# Patient Record
Sex: Male | Born: 1970 | Race: White | Hispanic: No | Marital: Married
Health system: Southern US, Community
[De-identification: ages and names within clinical notes are randomized; demographics above are authoritative.]

---

## 2013-08-25 LAB — CBC
HCT: 43.1 % (ref 40.0–52.0)
HGB: 15.2 g/dL (ref 13.0–18.0)
MCH: 28.9 pg (ref 26.0–34.0)
MCV: 82 fL (ref 80–100)
Platelet: 299 10*3/uL (ref 150–440)
RDW: 13.2 % (ref 11.5–14.5)

## 2013-08-25 LAB — TROPONIN I: Troponin-I: <0.02 ng/mL

## 2013-08-26 ENCOUNTER — Observation Stay: Payer: Self-pay | Admitting: Internal Medicine

## 2013-08-26 LAB — COMPREHENSIVE METABOLIC PANEL
Albumin: 3.5 g/dL (ref 3.4–5.0)
Alkaline Phosphatase: 83 U/L
Anion Gap: 9 (ref 7–16)
Bilirubin,Total: 0.8 mg/dL (ref 0.2–1.0)
Calcium, Total: 8.6 mg/dL (ref 8.5–10.1)
Chloride: 94 mmol/L — ABNORMAL LOW (ref 98–107)
Co2: 26 mmol/L (ref 21–32)
Creatinine: 1.02 mg/dL (ref 0.60–1.30)
EGFR (African American): >60
Glucose: 398 mg/dL — ABNORMAL HIGH (ref 65–99)
Osmolality: 277 (ref 275–301)
Potassium: 4 mmol/L (ref 3.5–5.1)
SGOT(AST): 19 U/L (ref 15–37)
SGPT (ALT): 30 U/L (ref 12–78)
Sodium: 129 mmol/L — ABNORMAL LOW (ref 136–145)
Total Protein: 7.7 g/dL (ref 6.4–8.2)

## 2013-08-26 LAB — LIPID PANEL
Cholesterol: 316 mg/dL — ABNORMAL HIGH (ref 0–200)
HDL Cholesterol: 20 mg/dL — ABNORMAL LOW (ref 40–60)
Triglycerides: 2786 mg/dL — ABNORMAL HIGH (ref 0–200)

## 2013-08-26 LAB — TROPONIN I
Troponin-I: <0.02 ng/mL
Troponin-I: <0.02 ng/mL

## 2013-08-26 LAB — CK-MB: CK-MB: <0.5 ng/mL — ABNORMAL LOW (ref 0.5–3.6)

## 2013-08-26 LAB — CK
CK, Total: 67 U/L (ref 35–232)
CK, Total: 55 U/L (ref 35–232)

## 2013-08-26 LAB — PROTIME-INR: Prothrombin Time: 13 secs (ref 11.5–14.7)

## 2013-08-26 LAB — HEMOGLOBIN A1C: Hemoglobin A1C: 11.5 % — ABNORMAL HIGH (ref 4.2–6.3)

## 2013-08-27 LAB — CBC WITH DIFFERENTIAL/PLATELET
Basophil #: 0.1 10*3/uL (ref 0.0–0.1)
Basophil %: 0.8 %
Eosinophil #: 0.1 10*3/uL (ref 0.0–0.7)
Eosinophil %: 0.8 %
HGB: 13.4 g/dL (ref 13.0–18.0)
MCV: 82 fL (ref 80–100)
Monocyte #: 0.9 x10 3/mm (ref 0.2–1.0)
Monocyte %: 8.6 %
Neutrophil #: 8.1 10*3/uL — ABNORMAL HIGH (ref 1.4–6.5)
Platelet: 222 10*3/uL (ref 150–440)
RBC: 4.67 10*6/uL (ref 4.40–5.90)
WBC: 10.4 10*3/uL (ref 3.8–10.6)

## 2013-08-27 LAB — BASIC METABOLIC PANEL
Creatinine: 0.68 mg/dL (ref 0.60–1.30)
EGFR (African American): >60
EGFR (Non-African Amer.): >60
Osmolality: 261 (ref 275–301)
Potassium: 3.9 mmol/L (ref 3.5–5.1)
Sodium: 128 mmol/L — ABNORMAL LOW (ref 136–145)

## 2013-08-27 LAB — MAGNESIUM: Magnesium: 1.9 mg/dL

## 2014-10-08 IMAGING — CT CT ANGIO CHEST
2 of 6 series · 17 of 46 positions shown · IV contrast (APPLIED)
Comparison: None.

CLINICAL DATA: Acute onset of chest pain, back pain, and neck pain
with pressure. Sharp pleuritic chest pain.

EXAM:
CT ANGIOGRAPHY CHEST WITH CONTRAST
TECHNIQUE: Multidetector CT imaging of the chest was performed using the
standard protocol during bolus administration of intravenous
contrast. Multiplanar CT image reconstructions including MIPs were
obtained to evaluate the vascular anatomy.
CONTRAST:  100 mL Isovue 370

[Series 6: cta chest · axial · 0.81mm/px · z∈[-370,-88]mm · 14 of 155 slices shown]
[im 7/155  lung]
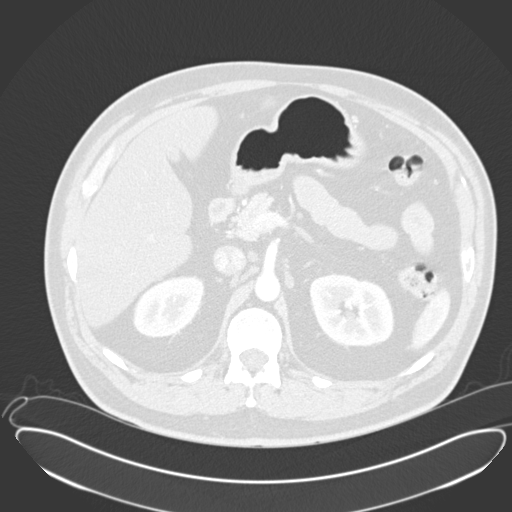
[im 21/155  soft-tissue]
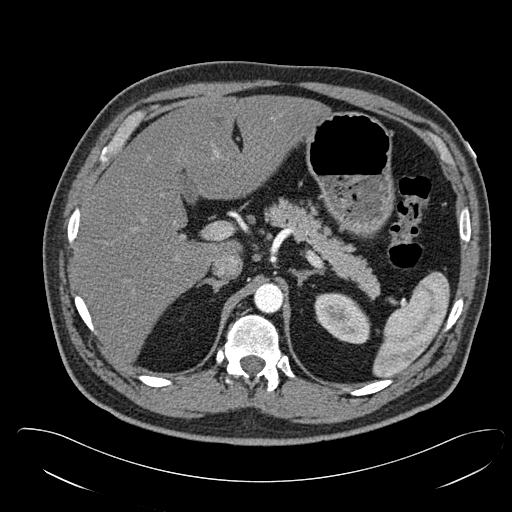
[im 27/155  lung]
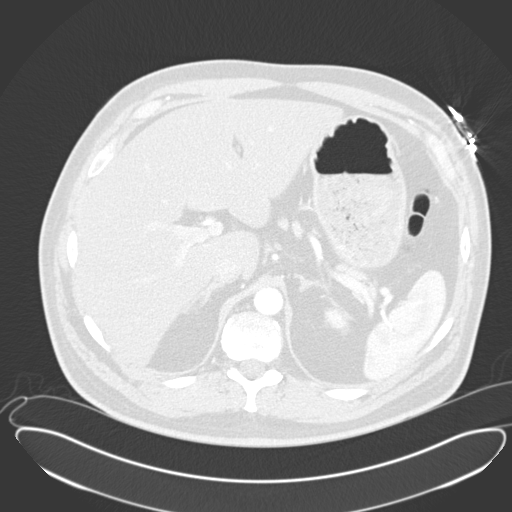
[im 41/155  soft-tissue]
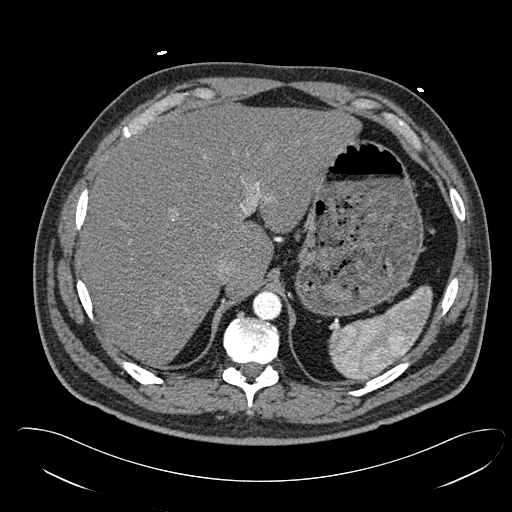
[im 54/155  lung]
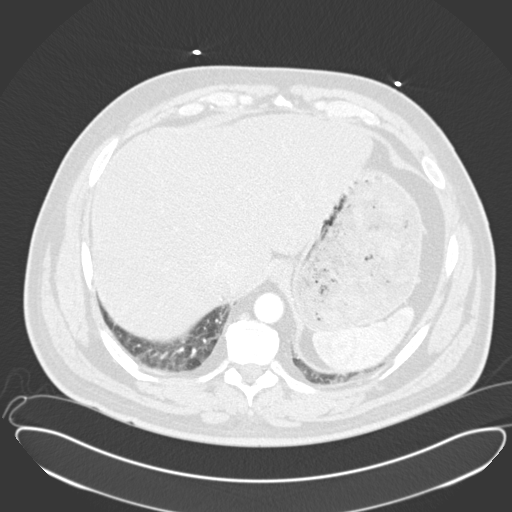
[im 61/155  soft-tissue]
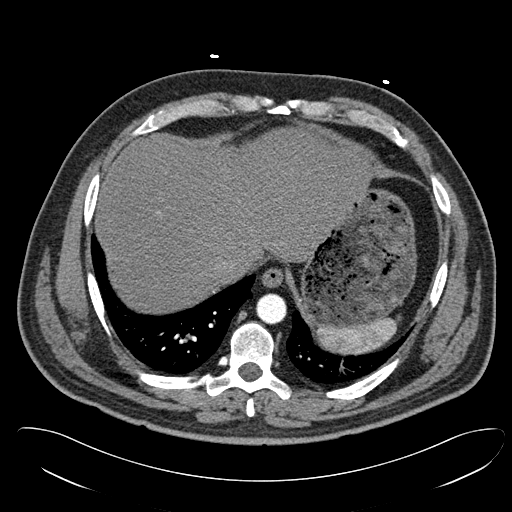
[im 74/155  lung]
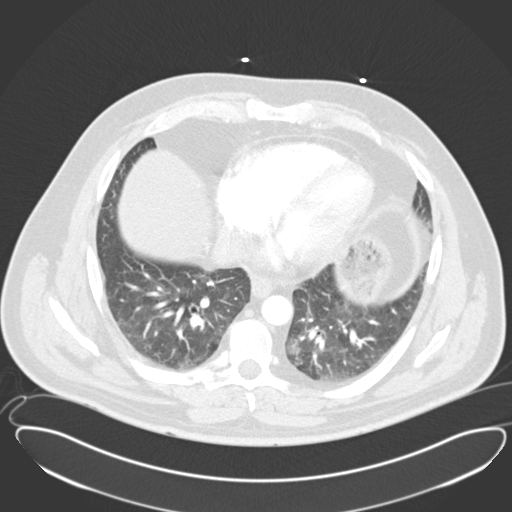
[im 81/155  soft-tissue]
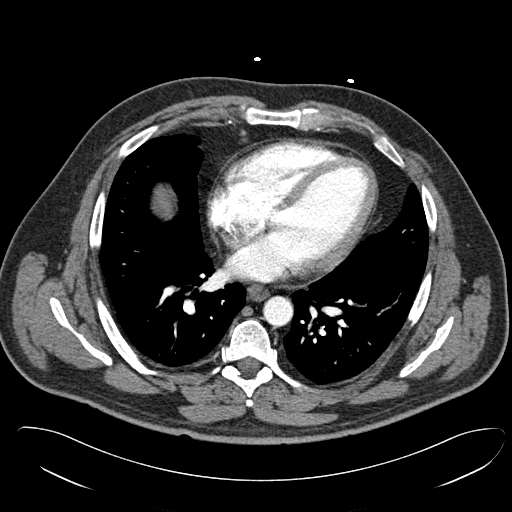
[im 94/155  lung]
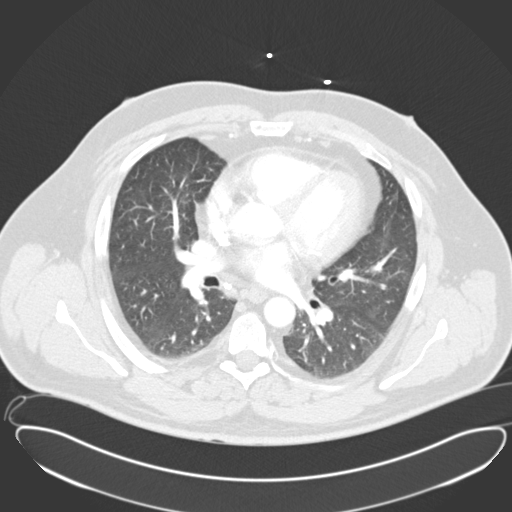
[im 101/155  soft-tissue]
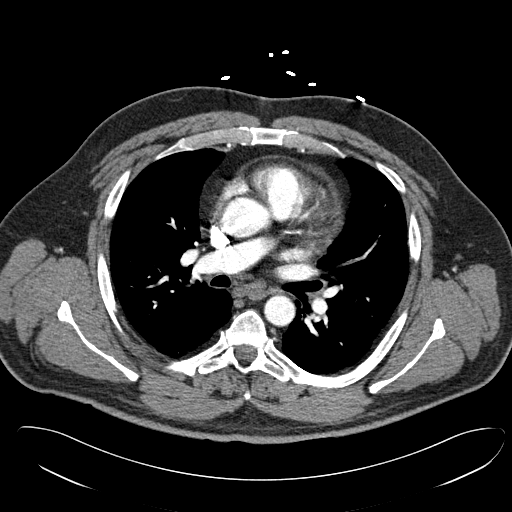
[im 114/155  lung]
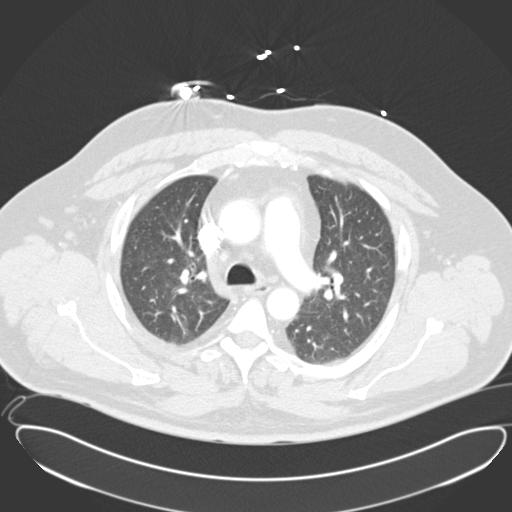
[im 128/155  soft-tissue]
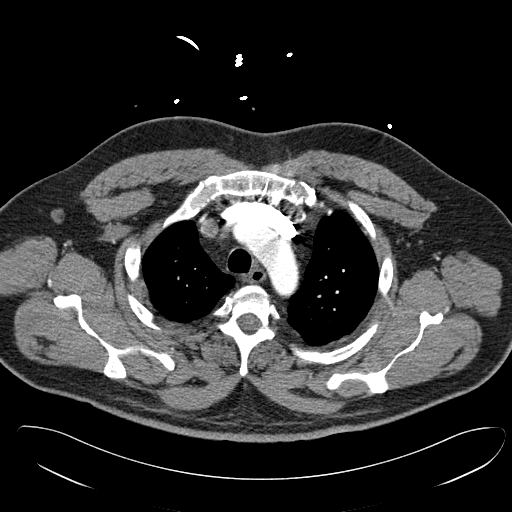
[im 134/155  lung]
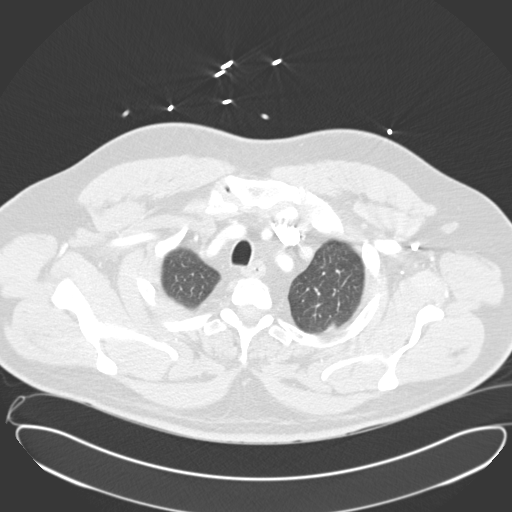
[im 148/155  soft-tissue]
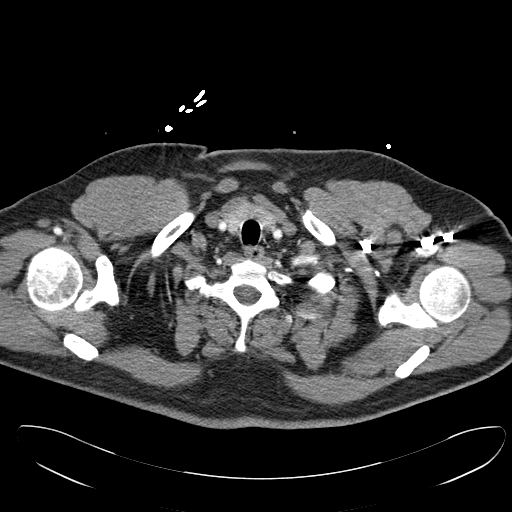

[Series 7: cor cta chest mpr · coronal · 0.59mm/px · 3 of 145 slices shown]
[im 37/145  soft-tissue]
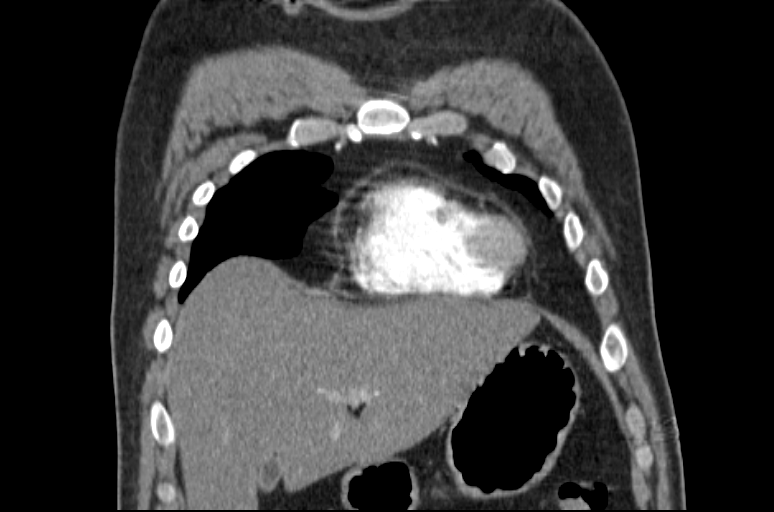
[im 73/145  soft-tissue]
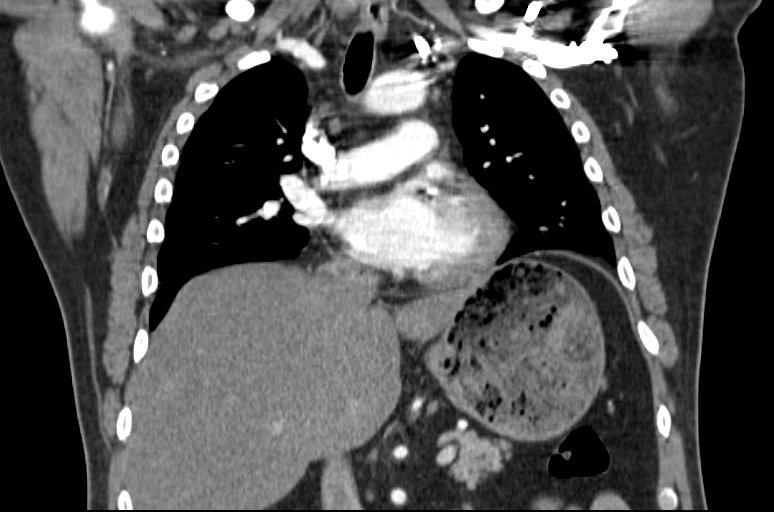
[im 109/145  soft-tissue]
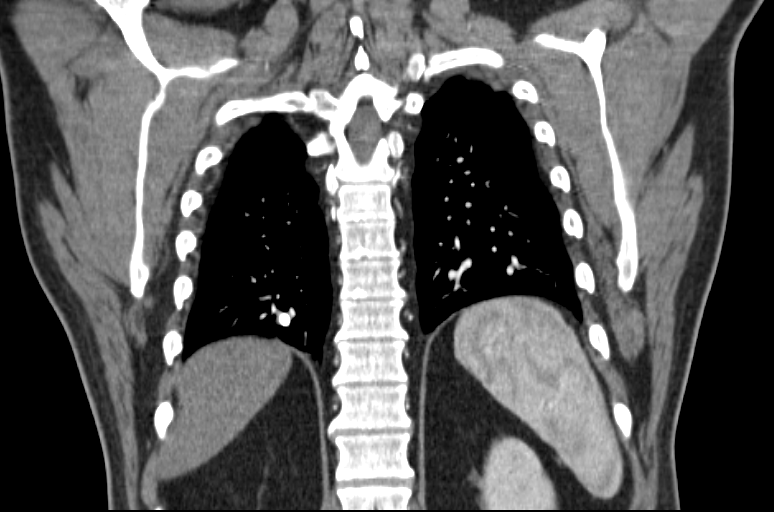

[17 of 46 positions shown; findings below may reference images not displayed]

FINDINGS: Noncontrast images of the chest demonstrate Coronary artery
calcifications. Minimal aortic calcification. No evidence of
intramural hematoma. Calcified granulomas in the right lung and
right hilum as well as in the subcarinal mediastinum.

Contrast enhanced CTA images demonstrate moderately good
opacification of the central and segmental pulmonary arteries with
technically adequate study. No focal filling defects are
demonstrated. No evidence of significant pulmonary embolus. Normal
caliber thoracic aorta. No evidence of aortic dissection or
aneurysm.

Normal heart size. Great vessels are patent. No significant
lymphadenopathy in the chest. Esophagus is decompressed. No pleural
effusions. Visualized portions of the upper abdominal organs
demonstrate diffuse fatty infiltration of the liver.

Visualization of lung fields is limited due to respiratory motion
artifact. Dependent changes in the lung bases. No definite
infiltration or consolidation. No pneumothorax. Airways appear
patent. No destructive bone lesions.

Review of the MIP images confirms the above findings.
IMPRESSION: No evidence of significant pulmonary embolus. No evidence of aortic
dissection. No evidence of active lung disease. Calcified granulomas
on the right. Diffuse fatty infiltration of the liver.

## 2014-12-18 NOTE — Consult Note (Signed)
PATIENT NAME:  Francisco Martinez, Francisco Martinez#:  [id number] DATE OF BIRTH:  [birth year]  DATE OF CONSULTATION:  08/26/2013  REFERRING PHYSICIAN:  Krystal EatonShayiq Ahmadzia, MD  CONSULTING PHYSICIAN:  A. Wendall MolaMelissa Hernandez Losasso, MD  CHIEF COMPLAINT: New diagnosis of diabetes and hypertriglyceridemia.   HISTORY OF PRESENT ILLNESS: This is a 44 year old male with a history of coronary disease, hypertension, tobacco abuse, who presented last evening to the ED with a complaint of chest pain. The patient recalls that he had some chest tightness throughout the afternoon. He gone to eat dinner at a restaurant which included hot wings and about two hours after the meal had worsening of his chest pain. He tried to take Rolaids with no relief. He described the pain as pleuritic in nature. It was nonradiating. He was not having any nausea. He was not having any shortness of breath or diaphoresis. In the ED,  his EKG showed Q waves in the anterolateral leads, no ST elevation. CT chest PE protocol was negative for PE, however, did demonstrate coronary calcification and hepatic steatosis. Around 5:00 a.m., the patient was given a GI cocktail and had complete resolution of his pain at that time. He has had three sets of cardiac enzymes which have been negative. Additional lab studies revealed an elevated blood sugar of 398, hemoglobin A1c 11.5%, consistent with new diagnosis of type 2 diabete, elevated triglycerides of 2786 with a normal lipase and mildly elevated white cell count of 11.7. He currently reports poor appetite, denies pain and has no other complaints.   Regarding diabetes, he has no personal or family history of diabetes. He denies recent polyuria, polydipsia or blurred vision. Denies recent weight loss. He does drink many glasses of sweet tea every day. He is a smoker and intends to quit. He has been smoking for over 20 years.   PAST MEDICAL HISTORY: 1.  Coronary disease status post PCI with stent placement. 2008.  2.  Hypertension.  3.   Tobacco dependence.   OUTPATIENT MEDICATIONS: None.   INPATIENT MEDICATIONS:  1.  Gemfibrozil 600 mg b.i.d.  2.  NovoLog insulin sliding scale.  3.  Nicotine patch.  4.  Pantoprazole 40 mg daily.  5.  Aspirin 81 mg daily.  6.  Normal saline at 75 mL/hour.   SOCIAL HISTORY: [place] in [place], but [place]. Again, he is a smoker.   FAMILY HISTORY: No known diabetes. Mother had coronary disease.   REVIEW OF SYSTEMS:  GENERAL: No weight loss. No fevers.  HEENT: No blurred vision. No sore throat.  NECK: No neck pain. No dysphasia.  CARDIAC: He said chest pain as per history of present illness. Denies palpitations.  PULMONARY: Denies cough. Denies shortness of breath.  ABDOMEN: Decreased appetite, and some abdominal discomfort at this time. No nausea.  EXTREMITIES: Denies leg swelling.  SKIN: Denies recent skin changes or pruritus.  ENDOCRINE:   Denies heat or cold intolerance.  GENITOURINARY: Denies dysuria, hematuria.  HEMATOLOGIC: Denies easy bruisability or recent bleeding.   PHYSICAL EXAMINATION: VITAL SIGNS: 70.9 inches, weight 220 pounds, BMI 30.7, blood pressure 118/65, pulse 89, respirations 18, pulse oximetry 95% on room air.  GENERAL: Well-developed, well-nourished white male in no acute distress.  HEENT: Extraocular movements are intact. Oropharynx is clear. Mucous membranes moist.  NECK: Supple.   CARDIAC: Regular rate and rhythm. No audible murmur, rub or gallop.  PULMONARY: Clear to auscultation bilaterally. No wheeze or rhonchi.  ABDOMEN: Mild epigastric tenderness  to palpate, positive bowel sounds.  EXTREMITIES: No edema is present.  SKIN: No rash or dermatopathy is present several tattoos are present.  NEUROLOGIC: Nonfocal. Alert and oriented x 3.  PSYCHIATRIC: Calm and cooperative.   LABORATORY DATA: See history of present illness for details.   RADIOLOGIC DATA:  CT chest with contrast showed no  evidence of PE. No evidence of aortic dissection. No evidence of acute lung disease. Coronary calcifications with minimal aortic calcifications are present. Calcified granulomas in right lung and right hilum as well as subcarinal mediastinum were present. Diffuse fatty infiltration of the liver.   ASSESSMENT: 1.  New diagnosis of diabetes, type II.  2.  Severe hypertriglyceridemia.  3.  Chest pain, cardiac versus gastrointestinal in origin.  4.  Obesity.  5.  Hepatic steatosis on CT scan.  6.  History of coronary artery disease.  7.  Tobacco dependence.   RECOMMENDATIONS:   1.  For diabetes, options include oral medications versus injectable medications. I suspect he will have reasonable glycemic control on oral medications as he is currently diabetic medication naive. Metformin would be the optimal choice, however, he did receive IV contrast last night. I do recommend starting metformin at a low dose of 500 mg twice daily after three days have passed since contrast exposure. In the meantime, we will add pioglitazone 15 mg per day. Will also start basal insulin and continue NovoLog sliding scale at meals and bedtime. I do not anticipate him being discharged on insulin, but rather anticipate discharge on pioglitazone and metformin. Agree with need for diabetes inpatient educator referral. Keep on a low carb, no concentrated sweet diet. Counseled patient about diagnosis, prognosis, complications and management plan.   2.  For hypertriglyceridemia, gemfibrozil has been added. Suspect his triglycerides will significantly improve with better blood sugar control.  3.  He would also benefit from addition of an ACE inhibitor for renal protection. This could be added of the next 1 to 2 days.  4.  The patient was counseled on the importance of tobacco cessation.  5.  He would benefit from weight loss.   Thank you for the kind request for consultation. I will follow along with you.    ____________________________ A. Wendall Mola, MD ams:cc D: 08/26/2013 17:32:34 ET T: 08/26/2013 19:41:22 ET JOB#: [id number]  cc: A. Wendall Mola, MD, <Dictator> Macy Mis MD ELECTRONICALLY SIGNED 08/27/2013 8:42

## 2014-12-19 NOTE — Discharge Summary (Signed)
PATIENT NAME:  Francisco Martinez, Kalan S MR#:  [id number] DATE OF BIRTH:  03/05/1971  DATE OF ADMISSION:  08/26/2013 DATE OF DISCHARGE:  08/27/2013  ADMISSION DIAGNOSES:  1.  Chest pain.  2.  New-onset diabetes.   DISCHARGE DIAGNOSES: 1.  Chest pain.  2.  New-onset diabetes.  3.  Tobacco dependence.  4.  Hyponatremia.    CONSULTATIONS: Dr. Tedd SiasSolum from endocrinology.   LABORATORY, DIAGNOSTIC AND RADIOLOGICAL DATA:   1.  Myoview stress test was negative for ischemia.  2.  Discharge white blood cells 10, hemoglobin 13.4, hematocrit 39, platelets are 222.  3.  Sodium 128, potassium 3.9, chloride 98, bicarb 27, BUN 9, creatinine 0.68, glucose is 191.  4.  Magnesium 1.9.  5.  Troponins x 3 were negative.  6.  LDL was not able to be calculated due to triglycerides of 2786.  7.  CT chest showed no evidence of PE or pneumonia.   HOSPITAL COURSE: This is a 44 year old male who presented with chest pain, found to have new-onset diabetes. For further details, please refer to the H and P. 1.  Chest pain. The patient was admitted to telemetry.  His cardiac enzymes were cycled, all of which were negative. He underwent a stress test which essentially was negative. He was chest pain-free throughout his hospitalization.  2.  New-onset diabetes. We appreciate endocrinology consult. The patient is started on Actos and will be started on metformin on January 2nd.  He could not start it right away due to the contrast he needed for the CT chest. We appreciate Dr. Pricilla HandlerSolum's consult. The patient will follow up as an outpatient with diabetes education as well as Dr. Tedd SiasSolum. Dr. Tedd SiasSolum also had recommended starting low-dose lisinopril for renal protection, which was started.  3.  Elevated triglyceridemia due to diabetes. I suspect that his triglycerides will improve once his diabetes is under control. He is on gemfibrozil for now.  4.  Hyponatremia of unclear etiology. The patient will follow up for his low sodium.  5.  Tobacco  abuse. The patient was counseled for greater than 3 minutes and he does not want a nicotine patch at discharge.   DISCHARGE MEDICATIONS: 1.  Lisinopril 5 mg daily.  2.  Pioglitazone 50 mg daily.  3.  Gemfibrozil 600 mg b.i.d.  4.  Nicotine 21 mg per 24 hours.  5.  Glucophage 500 mg b.i.d. to start on 08/29/2013.    DISCHARGE DIET: Low fat, carbohydrate diet.   DISCHARGE ACTIVITY: As tolerated.   DISCHARGE REFERRAL: Diabetes education.   DISCHARGE FOLLOWUP: The patient will follow up first available with Dr. Tedd SiasSolum.    The patient is medically stable for discharge.    TIME SPENT: 35 minutes.     ____________________________ Janyth ContesSital P. Juliene PinaMody, MD spm:cs D: 08/27/2013 16:01:01 ET T: 08/27/2013 20:11:53 ET JOB#: [id number]  cc: Lakya Schrupp P. Juliene PinaMody, MD, <Dictator> Janyth ContesSITAL P Fiora Weill MD ELECTRONICALLY SIGNED 08/28/2013 13:55

## 2014-12-19 NOTE — H&P (Signed)
PATIENT NAME:  Francisco Martinez, Francisco Martinez MR#:  [id number] DATE OF BIRTH:  08/21/1971  DATE OF ADMISSION:  08/25/2013  REFERRING PHYSICIAN: Dr. Lurline Hare.    PRIMARY CARE PHYSICIAN: None.   CARDIOLOGIST: None.   CHIEF COMPLAINT: Chest pain.   HISTORY OF PRESENT ILLNESS: This is a 44 year old male with a significant past medical history of coronary artery disease, status post stent in 2008, history of hypertension, hyperlipidemia, still ongoing tobacco use. The patient presents with complaint of chest pain. The patient lives in [place]. He is currently in [place]. 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 The patient reports chest discomfort, midsternal, radiating to the neck, pleuritic quality. The pain is worsened with deep inspiration and ambulation. The patient had CT chest with IV contrast which came back negative for PE. The patient's EKG showing Q waves in the anterolateral leads. No old EKG to compare. His first troponin was negative. The patient received 324 of aspirin in the ED. As well, he received subcutaneous Lovenox 1 mg/kg out of concern of unstable angina. Hospitalist service was requested to admit the patient for further evaluation. Upon initial presentation, the patient's blood pressure was on the lower side, but it did respond to fluid bolus. The patient reports he stopped taking his home medications over the last year or so as he does not think he needs them anymore basically.    PAST MEDICAL HISTORY:  1. Tobacco abuse.  2. Coronary artery disease, status post stent in 2008.   3. Hyperlipidemia.  4. Hypertension.  5. Noncompliance with medication.   PAST SURGICAL HISTORY: Status post cardiac cath and stent in 2008.   SOCIAL HISTORY: The patient smokes 1 pack per day. No alcohol. No illicit drug use.   FAMILY HISTORY: Significant for coronary artery disease.   ALLERGIES: No known drug allergies.   HOME MEDICATIONS: Currently not taking any home meds.   REVIEW OF SYSTEMS:   CONSTITUTIONAL: Denies fever, chills, fatigue, weakness, weight gain, weight loss.  EYES: Denies blurry vision, double vision, inflammation, glaucoma.  ENT: Denies tinnitus, ear pain, hearing loss.  RESPIRATORY: Denies any cough, wheezing. Complains of shortness of breath and painful respiration.  CARDIOVASCULAR: Complains of chest pain. Denies edema, arrhythmia, palpitation.  GASTROINTESTINAL: Denies nausea, vomiting, diarrhea, abdominal pain, hematemesis.  GENITOURINARY: Denies dysuria, hematuria, renal colic.  ENDOCRINE: Denies polyuria, polydipsia, heat or cold intolerance.  HEMATOLOGIC: Denies anemia, easy bruising, bleeding diathesis.  INTEGUMENTARY: Denies acne, rash or skin lesions.  MUSCULOSKELETAL: Denies any swelling, gout, arthritis or cramps.  NEUROLOGIC: Denies CVA, TIA, seizures, headache or dementia.  PSYCHIATRIC: Denies anxiety, insomnia or bipolar disorder.   PHYSICAL EXAMINATION:  VITAL SIGNS: , pulse 106, respiratory rate 20, blood pressure 116/65, saturating 97% on room air.  GENERAL: Well-nourished male, looks comfortable in bed, in no apparent distress.  HEENT: Head is atraumatic, normocephalic. Pupils equal, reactive to light. Pink conjunctivae. Anicteric sclerae. Moist oral mucosa.  NECK: Supple. No thyromegaly. No JVD.  CHEST: Good air entry bilaterally. No wheezing, rales or rhonchi. Has reproducible chest pain on palpation but reports it is a different kind of pain than the one he presented with.  CARDIOVASCULAR: S1, S2 heard. No rubs, murmurs or gallops.  ABDOMEN: Soft, nontender, nondistended. Bowel sounds present.  EXTREMITIES: No edema. No clubbing. No cyanosis.  PSYCHIATRIC: Appropriate affect. Awake, alert x 3. Intact judgment and insight.  NEUROLOGIC: Cranial nerves grossly intact. Motor 5 out of 5. No focal deficits.  SKIN: Normal skin turgor. Warm and dry.  Has multiple skin tattoos.  LYMPHATICS: No cervical lymphadenopathy could be appreciated.    PERTINENT LABORATORIES: Glucose 398, BUN 60, creatinine 1.02, sodium 129, potassium 4, chloride 94, CO2 26. ALT 30, AST 19, alk phos 83. Troponin less than 0.02, CK-MB less than 0.5. White blood cells 11.5, hemoglobin 15.3, hematocrit 43.1, platelet 299. INR is 1.   IMAGING: CT chest with IV contrast: There is no significant evidence of PE.   ASSESSMENT AND PLAN:  1. Chest pain: Chest pain has musculoskeletal and pleuritic quality, but given the fact of the patient's multiple risk factors, he will be admitted to telemetry unit. Will continue to cycle his cardiac enzymes. We will give him 324 mg of aspirin. The patient received subcutaneous Lovenox in the Emergency Department out of concern of unstable angina. If the patient's cardiac enzymes are negative, will schedule him for a stress test in the morning. Will continue him on aspirin.  2. Hypertension: Actually, blood pressure was on the lower side, so will hold on starting any medications at this point.  3. Hyperlipidemia: Will check lipid panel and if needed, will start him on statin.  4. Tobacco abuse: The patient was counseled. Will be started on NicoDerm patch.  5. Medication noncompliance: The patient was counseled.  6. Hyperglycemia: The patient denies any history of diabetes. Will check hemoglobin A1c and will check fingersticks.  7. Deep vein thrombosis prophylaxis: The patient received 1 dose of subcutaneous Lovenox currently. Depending on his workup, the decision will be made on prophylaxis versus treatment dose.   CODE STATUS: The patient reports he is a FULL CODE.   TOTAL TIME SPENT ON ADMISSION AND PATIENT CARE: 45 minutes.   ____________________________ Albertine Patricia, MD dse:gb D: 08/26/2013 02:55:52 ET T: 08/26/2013 04:47:24 ET JOB#: [id number]  cc: Albertine Patricia, MD, <Dictator> Olar Santini Graciela Husbands MD ELECTRONICALLY SIGNED 08/29/2013 2:36
